# Patient Record
Sex: Male | Born: 2000 | Race: Black or African American | Hispanic: No | Marital: Single | Smoking: Never smoker
Health system: Southern US, Community
[De-identification: ages and names within clinical notes are randomized; demographics above are authoritative.]

## PROBLEM LIST (undated history)

## (undated) DIAGNOSIS — F909 Attention-deficit hyperactivity disorder, unspecified type: Secondary | ICD-10-CM

---
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
## 2018-05-31 IMAGING — DX DG FOOT COMPLETE 3+V*R*
3 series · 3 of 3 positions shown · non-contrast
Comparison: None.

CLINICAL DATA: Right lateral ankle pain during workout today.

EXAM:
RIGHT FOOT COMPLETE - 3+ VIEW

[foot ap]
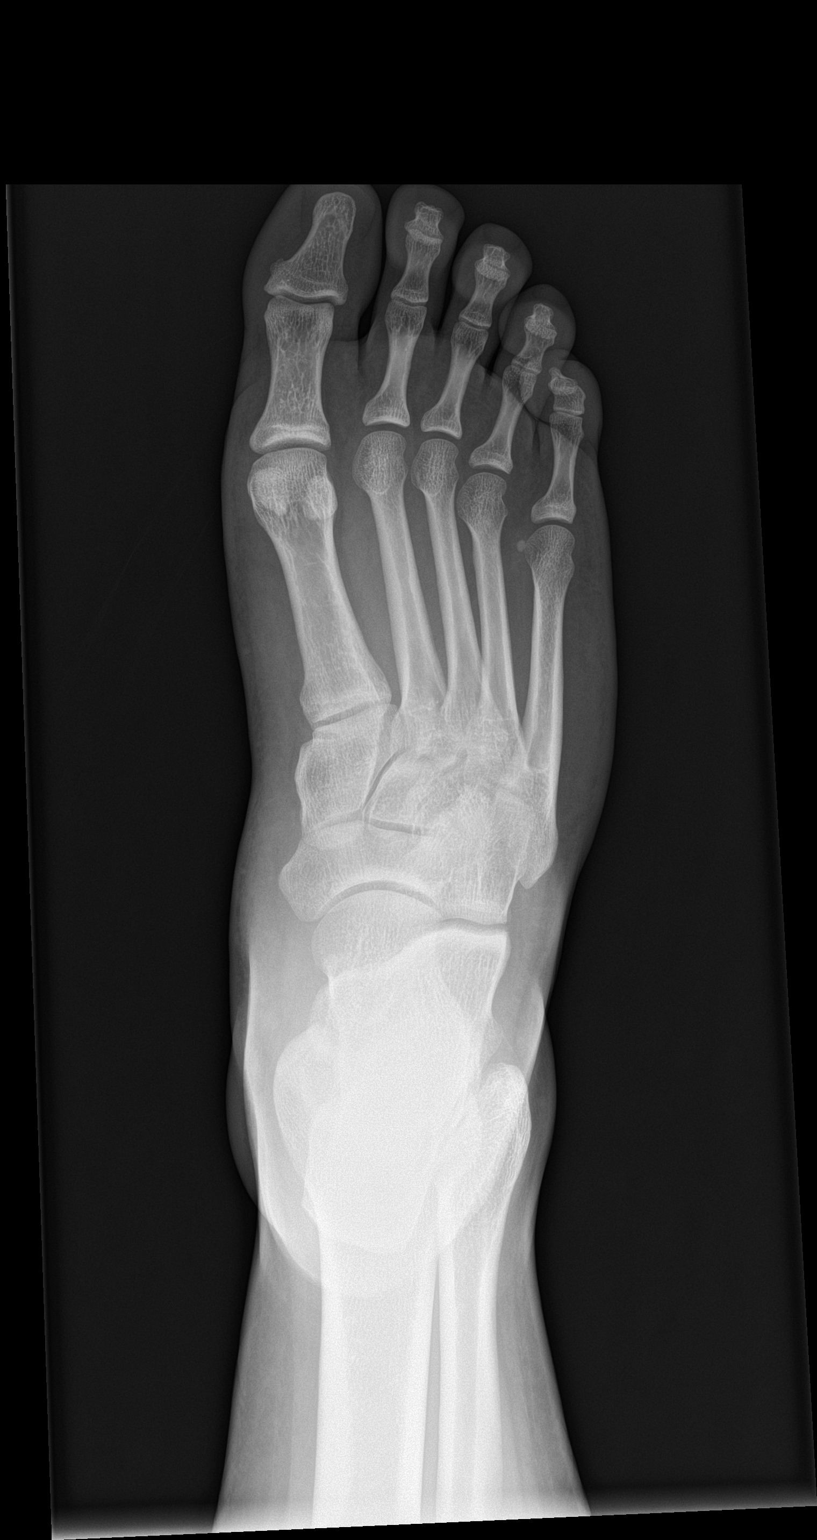

[foot obl]
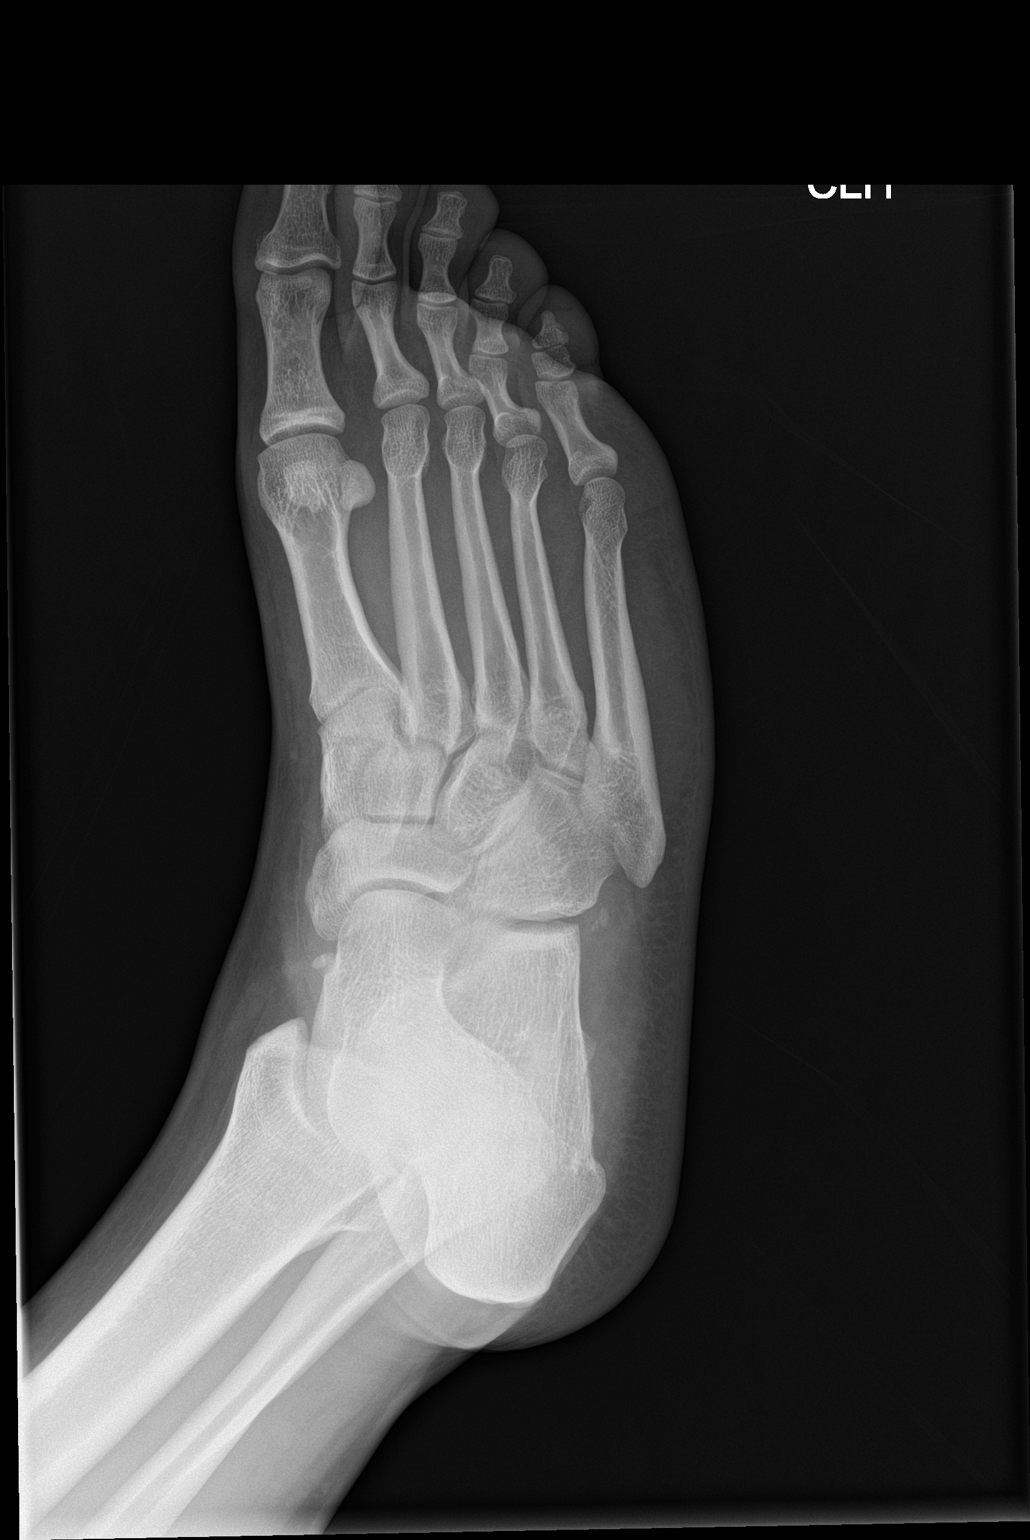

[foot lat]
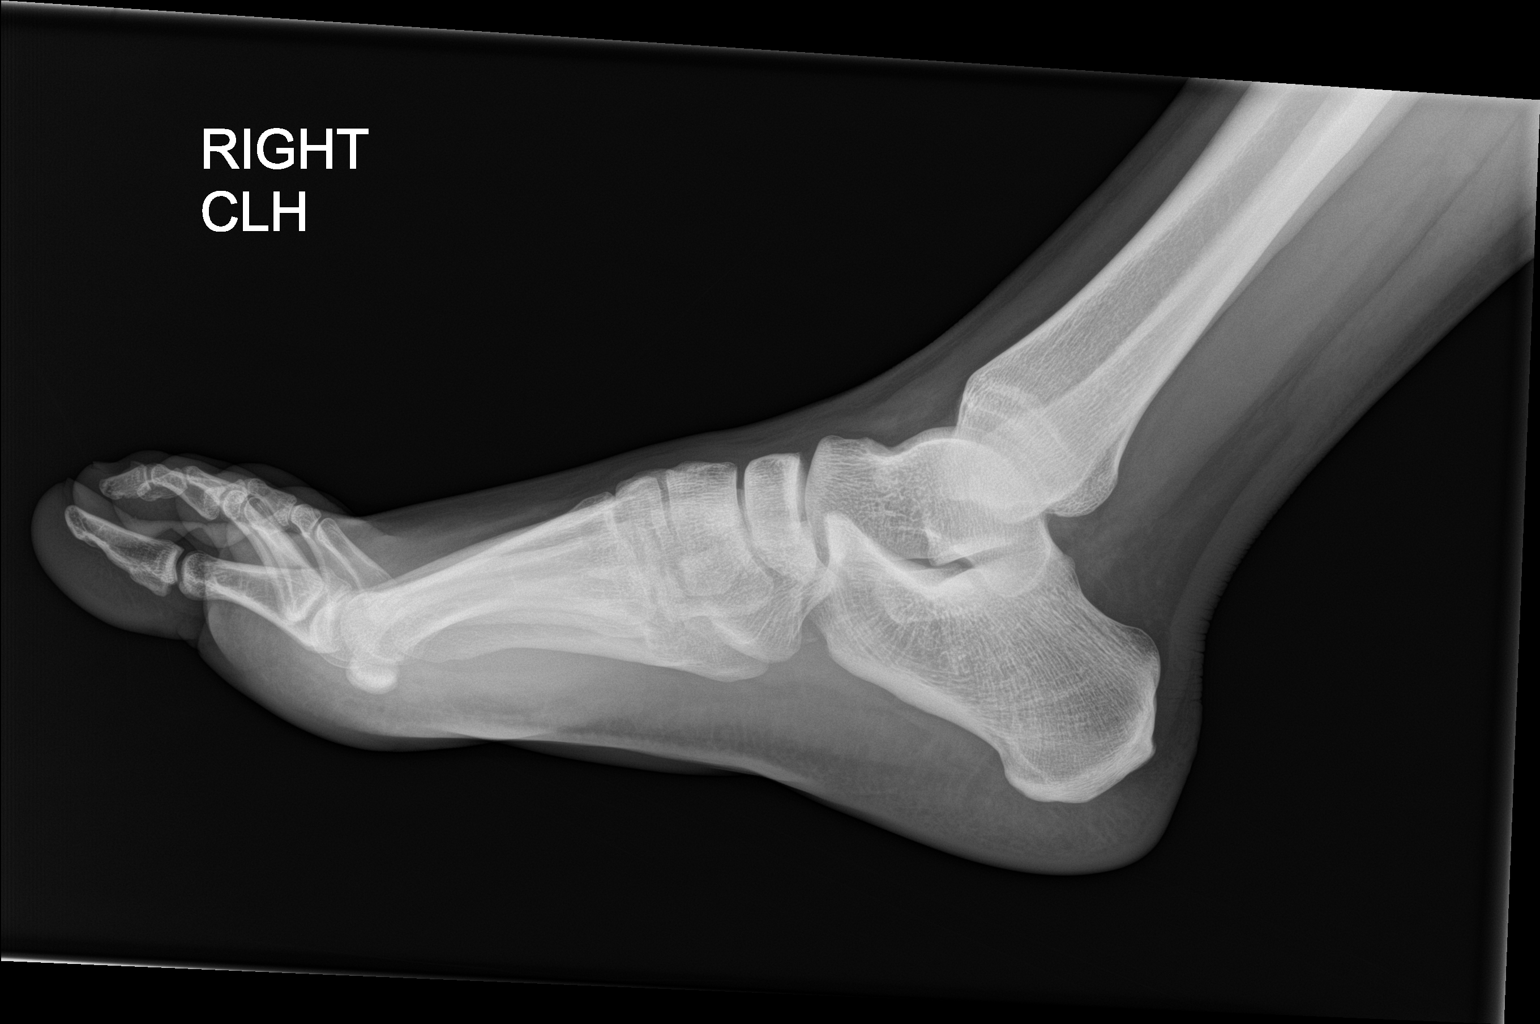

[3 of 3 positions shown; findings below may reference images not displayed]

FINDINGS: There is no evidence of fracture or dislocation. There is no
evidence of arthropathy or other focal bone abnormality. Soft
tissues are unremarkable.
IMPRESSION: Negative.

## 2020-12-18 ENCOUNTER — Other Ambulatory Visit: Payer: Self-pay

## 2020-12-18 ENCOUNTER — Ambulatory Visit
Admission: EM | Admit: 2020-12-18 | Discharge: 2020-12-18 | Disposition: A | Discharge status: Home / Self Care | Payer: Medicaid Other | Attending: Internal Medicine | Admitting: Internal Medicine

## 2020-12-18 DIAGNOSIS — Z23 Encounter for immunization: Secondary | ICD-10-CM | POA: Diagnosis not present

## 2020-12-18 DIAGNOSIS — S91331A Puncture wound without foreign body, right foot, initial encounter: Secondary | ICD-10-CM

## 2020-12-18 MED ORDER — CEPHALEXIN 500 MG PO CAPS: 500.0000 mg | ORAL_CAPSULE | Freq: Four times a day (QID) | ORAL | 0 refills | Status: AC

## 2020-12-18 MED ORDER — TETANUS-DIPHTH-ACELL PERTUSSIS 5-2.5-18.5 LF-MCG/0.5 IM SUSY
0.5000 mL | PREFILLED_SYRINGE | Freq: Once | INTRAMUSCULAR | Status: AC
  Administered 2020-12-18: 19:00:00 0.5 mL via INTRAMUSCULAR

## 2020-12-18 NOTE — Discharge Instructions (Addendum)
You have been given a tetanus vaccine due to puncture wound of right foot.  You have also been prescribed cephalexin antibiotic to prevent infection of puncture wound.  Please continue to monitor puncture wound for any signs of infection including redness, swelling, pus.  Please follow-up if these symptoms occur.  You may use triple antibiotic ointment daily to prevent infection as well.

## 2020-12-18 NOTE — ED Triage Notes (Signed)
Patient presents to Urgent Care with complaints of right foot injury from stepping on a nail today. Last Tdap 06/12/2010. Pt cleansed foot with soap and water.

## 2020-12-18 NOTE — ED Provider Notes (Signed)
[facility]    CSN: [id number] Arrival date & time: 12/18/20  1804      History   Chief Complaint Chief Complaint  Patient presents with   Foot Injury    HPI TRESTON COKER is a 20 y.o. male.   Patient presents to the urgent care due to stepping on a nail with his right foot today while at work.  Patient states that he was wearing tennis shoes when this occurred.  Patient reports that nail did not go in very far.  Last tetanus vaccine was in 2011 per patient.   Foot Injury  Past Medical History:  Diagnosis Date   ADHD (attention deficit hyperactivity disorder)     There are no problems to display for this patient.   History reviewed. No pertinent surgical history.     Home Medications    Prior to Admission medications   Medication Sig Start Date End Date Taking? Authorizing Provider  cephALEXin (KEFLEX) 500 MG capsule Take 1 capsule (500 mg total) by mouth 4 (four) times daily for 7 days. 12/18/20 12/25/20 Yes Lance Muss, FNP  acetaminophen (TYLENOL) 325 MG tablet Take 2 tablets (650 mg total) by mouth every 6 (six) hours as needed for mild pain or moderate pain. 12/07/16   Scoville, Nadara Mustard, NP  FIBER SELECT GUMMIES CHEW Chew 1 each by mouth daily.    [provider]  fluconazole (DIFLUCAN) 150 MG tablet Take 1 tablet (150 mg total) by mouth once a week. 05/18/17   Wallis Bamberg, PA-C  ibuprofen (ADVIL,MOTRIN) 800 MG tablet Take 1 tablet (800 mg total) by mouth every 8 (eight) hours as needed for mild pain or moderate pain. 12/15/16   Charlynne Pander, MD  ketoconazole (NIZORAL) 2 % cream Apply 1 application topically daily. 05/18/17   Wallis Bamberg, PA-C  lisdexamfetamine (VYVANSE) 70 MG capsule Take 70 mg by mouth every morning.    [provider]  Multiple Vitamin (MULTIVITAMIN WITH MINERALS) TABS tablet Take 1 tablet by mouth daily.    [provider]    Family History History reviewed. No pertinent family  history.  Social History Social History   Tobacco Use   Smoking status: Never   Smokeless tobacco: Never  Vaping Use   Vaping Use: Never used  Substance Use Topics   Alcohol use: No   Drug use: No     Allergies   Patient has no known allergies.   Review of Systems Review of Systems Per HPI  Physical Exam Triage Vital Signs ED Triage Vitals  Enc Vitals Group     BP 12/18/20 1828 138/87     Pulse Rate 12/18/20 1828 93     Resp 12/18/20 1828 14     Temp 12/18/20 1828 98.4 F (36.9 C)     Temp Source 12/18/20 1828 Oral     SpO2 12/18/20 1828 95 %     Weight --      Height --      Head Circumference --      Peak Flow --      Pain Score 12/18/20 1827 2     Pain Loc --      Pain Edu? --      Excl. in GC? --    No data found.  Updated Vital Signs BP 138/87 (BP Location: Right Arm)   Pulse 93   Temp 98.4 F (36.9 C) (Oral)   Resp 14   SpO2 95%   Visual Acuity  Right Eye Distance:   Left Eye Distance:   Bilateral Distance:    Right Eye Near:   Left Eye Near:    Bilateral Near:     Physical Exam Constitutional:      Appearance: Normal appearance.  HENT:     Head: Normocephalic and atraumatic.  Eyes:     Extraocular Movements: Extraocular movements intact.     Conjunctiva/sclera: Conjunctivae normal.  Pulmonary:     Effort: Pulmonary effort is normal.  Skin:    General: Skin is warm and dry.     Findings: Wound present.     Comments: Puncture wound to right plantar surface of foot directly above right heel.  No signs of infection.  Neurological:     General: No focal deficit present.     Mental Status: He is alert and oriented to person, place, and time. Mental status is at baseline.  Psychiatric:        Mood and Affect: Mood normal.        Behavior: Behavior normal.        Thought Content: Thought content normal.        Judgment: Judgment normal.     UC Treatments / Results  Labs (all labs ordered are listed, but only abnormal results are  displayed) Labs Reviewed - No data to display  EKG   Radiology No results found.  Procedures Procedures (including critical care time)  Medications Ordered in UC Medications  Tdap (BOOSTRIX) injection 0.5 mL (0.5 mLs Intramuscular Given 12/18/20 1858)    Initial Impression / Assessment and Plan / UC Course  I have reviewed the triage vital signs and the nursing notes.  Pertinent labs & imaging results that were available during my care of the patient were reviewed by me and considered in my medical decision making (see chart for details).     Tetanus updated today.  Prescribed cephalexin x7 days as prophylaxis for infection.  Patient advised to monitor for signs of infection including redness, swelling, purulent drainage.  Advised patient to wash daily with Dial soap and to use triple antibiotic ointment. Discussed strict return precautions. Patient verbalized understanding and is agreeable with plan.  Final Clinical Impressions(s) / UC Diagnoses   Final diagnoses:  Puncture wound of right foot, initial encounter     Discharge Instructions      You have been given a tetanus vaccine due to puncture wound of right foot.  You have also been prescribed cephalexin antibiotic to prevent infection of puncture wound.  Please continue to monitor puncture wound for any signs of infection including redness, swelling, pus.  Please follow-up if these symptoms occur.  You may use triple antibiotic ointment daily to prevent infection as well.     ED Prescriptions     Medication Sig Dispense Auth. Provider   cephALEXin (KEFLEX) 500 MG capsule Take 1 capsule (500 mg total) by mouth 4 (four) times daily for 7 days. 28 capsule Lance Muss, FNP      PDMP not reviewed this encounter.   Lance Muss, FNP 12/18/20 [phone]

## 2021-07-19 ENCOUNTER — Encounter: Payer: Self-pay | Admitting: Pediatrics
